# Patient Record
Sex: Male | Born: 1965 | Race: White | Hispanic: No | Smoking: Current every day smoker
Health system: Southern US, Community
[De-identification: ages and names within clinical notes are randomized; demographics above are authoritative.]

## PROBLEM LIST (undated history)

## (undated) DIAGNOSIS — K589 Irritable bowel syndrome without diarrhea: Secondary | ICD-10-CM

## (undated) HISTORY — PX: BACK SURGERY: SHX140

## (undated) HISTORY — PX: KNEE SURGERY: SHX244

## (undated) HISTORY — PX: CHOLECYSTECTOMY: SHX55

---

## 2006-06-05 ENCOUNTER — Emergency Department (HOSPITAL_COMMUNITY): Admission: EM | Admit: 2006-06-05 | Discharge: 2006-06-05 | Payer: Self-pay | Admitting: Emergency Medicine

## 2007-05-16 ENCOUNTER — Emergency Department (HOSPITAL_COMMUNITY): Admission: EM | Admit: 2007-05-16 | Discharge: 2007-05-16 | Payer: Self-pay | Admitting: Emergency Medicine

## 2011-10-08 ENCOUNTER — Emergency Department (HOSPITAL_BASED_OUTPATIENT_CLINIC_OR_DEPARTMENT_OTHER)
Admission: EM | Admit: 2011-10-08 | Discharge: 2011-10-08 | Disposition: A | Discharge status: Home / Self Care | Payer: Self-pay | Attending: Emergency Medicine | Admitting: Emergency Medicine

## 2011-10-08 ENCOUNTER — Encounter (HOSPITAL_BASED_OUTPATIENT_CLINIC_OR_DEPARTMENT_OTHER): Payer: Self-pay | Admitting: *Deleted

## 2011-10-08 DIAGNOSIS — K029 Dental caries, unspecified: Secondary | ICD-10-CM | POA: Insufficient documentation

## 2011-10-08 DIAGNOSIS — K1370 Unspecified lesions of oral mucosa: Secondary | ICD-10-CM | POA: Insufficient documentation

## 2011-10-08 DIAGNOSIS — F17200 Nicotine dependence, unspecified, uncomplicated: Secondary | ICD-10-CM | POA: Insufficient documentation

## 2011-10-08 MED ORDER — PENICILLIN V POTASSIUM 500 MG PO TABS: 500.0000 mg | ORAL_TABLET | Freq: Four times a day (QID) | ORAL | Status: AC

## 2011-10-08 MED ORDER — CLINDAMYCIN HCL 150 MG PO CAPS
300.0000 mg | ORAL_CAPSULE | Freq: Once | ORAL | Status: AC
  Administered 2011-10-08: 300 mg via ORAL
  Filled 2011-10-08: qty 2

## 2011-10-08 MED ORDER — HYDROCODONE-ACETAMINOPHEN 5-325 MG PO TABS: 1.0000 | ORAL_TABLET | Freq: Four times a day (QID) | ORAL | Status: AC | PRN

## 2011-10-08 MED ORDER — TRAMADOL HCL 50 MG PO TABS
50.0000 mg | ORAL_TABLET | Freq: Once | ORAL | Status: AC
  Administered 2011-10-08: 50 mg via ORAL
  Filled 2011-10-08: qty 1

## 2011-10-08 NOTE — ED Notes (Signed)
Dental pain    States has an abscess tooth causing pain and also headache

## 2011-10-08 NOTE — ED Provider Notes (Addendum)
History     CSN: [id number]  Arrival date & time 10/08/11  2218   First MD Initiated Contact with Patient 10/08/11 2258      Chief Complaint  Patient presents with  . Oral Swelling    (Consider location/radiation/quality/duration/timing/severity/associated sxs/prior treatment) Patient is a 46 y.o. male presenting with tooth pain. The history is provided by the patient. No language interpreter was used.  Dental PainThe primary symptoms include oral lesions. Primary symptoms do not include mouth pain, dental injury, oral bleeding, headaches, fever, shortness of breath, sore throat, angioedema or cough. The symptoms began 2 to 6 hours ago. The symptoms are worsening. The symptoms are new. The symptoms occur constantly.  The oral lesion began 3 - 5 days ago. The oral lesion is worsening. The oral lesion is new. Affected locations include: teeth.  Additional symptoms include: dental sensitivity to temperature and gum tenderness. Additional symptoms do not include: purulent gums, trismus, jaw pain, trouble swallowing, pain with swallowing, excessive salivation and swollen glands. Medical issues include: smoking.    History reviewed. No pertinent past medical history.  Past Surgical History  Procedure Date  . Back surgery   . Knee surgery     No family history on file.  History  Substance Use Topics  . Smoking status: Current Everyday Smoker -- 0.5 packs/day  . Smokeless tobacco: Not on file  . Alcohol Use: No      Review of Systems  Constitutional: Negative for fever.  HENT: Negative.  Negative for sore throat, trouble swallowing and neck pain.   Eyes: Negative.   Respiratory: Negative for cough and shortness of breath.   Cardiovascular: Negative.   Gastrointestinal: Negative.   Genitourinary: Negative.   Musculoskeletal: Negative.   Neurological: Negative for headaches.  Hematological: Negative.   Psychiatric/Behavioral: Negative.     Allergies  Review of patient's  allergies indicates no known allergies.  Home Medications   Current Outpatient Rx  Name Route Sig Dispense Refill  . ACETAMINOPHEN 500 MG PO TABS Oral Take 2,000 mg by mouth every 6 (six) hours as needed. For pain    . IBUPROFEN 200 MG PO TABS Oral Take 800 mg by mouth every 6 (six) hours as needed. For pain    . ADULT MULTIVITAMIN W/MINERALS CH Oral Take 1 tablet by mouth daily.      BP 150/80  Pulse 86  Temp(Src) 98.7 F (37.1 C) (Oral)  Resp 26  SpO2 99%  Physical Exam  Vitals reviewed. Constitutional: He is oriented to person, place, and time. He appears well-developed and well-nourished.  HENT:  Head: Normocephalic and atraumatic.  Mouth/Throat: Oropharynx is clear and moist and mucous membranes are normal. Abnormal dentition. Dental abscesses present. No oropharyngeal exudate or posterior oropharyngeal edema.         No swelling of the floor of the mouth, no swelling of the glands of the submandibular space.  No skin warmth no gum abscess No trismus No swelling of the lips tongue or uvula  Eyes: Conjunctivae are normal. Pupils are equal, round, and reactive to light.  Neck: No tracheal deviation present.  Cardiovascular: Normal rate and regular rhythm.   Pulmonary/Chest: Effort normal and breath sounds normal.  Abdominal: Soft. Bowel sounds are normal. There is no tenderness.  Musculoskeletal: Normal range of motion.  Lymphadenopathy:    He has no cervical adenopathy.  Neurological: He is alert and oriented to person, place, and time.  Skin: Skin is warm and dry.  Psychiatric: He has a normal  mood and affect.    ED Course  Procedures (including critical care time)  Labs Reviewed - No data to display No results found.   No diagnosis found.    MDM  Patient told to return for drainage, facial swelling inability to fully open mouth, shortness of breath difficulty swallowing or any concerns.  Take all antibiotics and see a dentist this week  Patient verbalizes  understanding and agrees to follow up       Eleri Ruben K Darrion Wyszynski-Rasch, MD 10/08/11 2333

## 2011-10-08 NOTE — ED Notes (Signed)
Swelling to the right side of his face for about 3 days. States he has an abscessed tooth.

## 2014-07-02 ENCOUNTER — Emergency Department (HOSPITAL_BASED_OUTPATIENT_CLINIC_OR_DEPARTMENT_OTHER)
Admission: EM | Admit: 2014-07-02 | Discharge: 2014-07-02 | Disposition: A | Discharge status: Home / Self Care | Payer: BC Managed Care – PPO | Attending: Emergency Medicine | Admitting: Emergency Medicine

## 2014-07-02 ENCOUNTER — Encounter (HOSPITAL_BASED_OUTPATIENT_CLINIC_OR_DEPARTMENT_OTHER): Payer: Self-pay | Admitting: Emergency Medicine

## 2014-07-02 ENCOUNTER — Emergency Department (HOSPITAL_BASED_OUTPATIENT_CLINIC_OR_DEPARTMENT_OTHER): Payer: BC Managed Care – PPO

## 2014-07-02 DIAGNOSIS — F419 Anxiety disorder, unspecified: Secondary | ICD-10-CM | POA: Insufficient documentation

## 2014-07-02 DIAGNOSIS — R202 Paresthesia of skin: Secondary | ICD-10-CM | POA: Insufficient documentation

## 2014-07-02 DIAGNOSIS — Z0130 Encounter for examination of blood pressure without abnormal findings: Secondary | ICD-10-CM

## 2014-07-02 DIAGNOSIS — Z0131 Encounter for examination of blood pressure with abnormal findings: Secondary | ICD-10-CM | POA: Diagnosis not present

## 2014-07-02 DIAGNOSIS — Z720 Tobacco use: Secondary | ICD-10-CM | POA: Diagnosis not present

## 2014-07-02 DIAGNOSIS — K589 Irritable bowel syndrome without diarrhea: Secondary | ICD-10-CM | POA: Diagnosis not present

## 2014-07-02 DIAGNOSIS — Z79899 Other long term (current) drug therapy: Secondary | ICD-10-CM | POA: Insufficient documentation

## 2014-07-02 DIAGNOSIS — R031 Nonspecific low blood-pressure reading: Secondary | ICD-10-CM | POA: Insufficient documentation

## 2014-07-02 HISTORY — DX: Irritable bowel syndrome, unspecified: K58.9

## 2014-07-02 LAB — BASIC METABOLIC PANEL WITH GFR
Anion gap: 14 (ref 5–15)
BUN: 17 mg/dL (ref 6–23)
CO2: 26 meq/L (ref 19–32)
Calcium: 9.1 mg/dL (ref 8.4–10.5)
Chloride: 101 meq/L (ref 96–112)
Creatinine, Ser: 1 mg/dL (ref 0.50–1.35)
GFR calc Af Amer: >90 mL/min
GFR calc non Af Amer: 87 mL/min — ABNORMAL LOW
Glucose, Bld: 103 mg/dL — ABNORMAL HIGH (ref 70–99)
Potassium: 4.3 meq/L (ref 3.7–5.3)
Sodium: 141 meq/L (ref 137–147)

## 2014-07-02 LAB — CBC
HCT: 40.8 % (ref 39.0–52.0)
Hemoglobin: 14.1 g/dL (ref 13.0–17.0)
MCH: 29.1 pg (ref 26.0–34.0)
MCHC: 34.6 g/dL (ref 30.0–36.0)
MCV: 84.1 fL (ref 78.0–100.0)
Platelets: 211 10*3/uL (ref 150–400)
RBC: 4.85 MIL/uL (ref 4.22–5.81)
RDW: 14.2 % (ref 11.5–15.5)
WBC: 6.4 10*3/uL (ref 4.0–10.5)

## 2014-07-02 LAB — TROPONIN I: Troponin I: <0.3 ng/mL (ref <0.30)

## 2014-07-02 NOTE — ED Notes (Signed)
PA at bedside.

## 2014-07-02 NOTE — ED Notes (Signed)
D/c home with family/friend- no new rx given - resource guide given

## 2014-07-02 NOTE — ED Notes (Addendum)
Walked in to the ED with a hand held BP monitor. States his monitor is telling him he is in cardiac arrest. He googled it. Denies pain. Tingling in his left hand and tongue for several days. He was recently started on medication for possible IBS.

## 2014-07-02 NOTE — ED Provider Notes (Signed)
CSN: [id number]     Arrival date & time 07/02/14  1348 History   First MD Initiated Contact with Patient 07/02/14 1429     Chief Complaint  Patient presents with  . Blood Pressure Check     (Consider location/radiation/quality/duration/timing/severity/associated sxs/prior Treatment) HPI Comments: This is a 48 year old male with a past medical history of IBS who presents to the emergency department with concerns of his heart after an abnormal reading on his handheld blood pressure monitor. Patient states 5 days ago he was started on until for his IBS, however since starting this medication he has been experiencing lip tingling and tingling into his fingers. He called the gastroenterologist who advised him to stop the medication. States he also told her GI specialist that he was getting a were did sound sensation when he was stepping as if he could "hear his steps water". She advised him to get a blood pressure monitor. When he used the monitor for the first time today, it read his blood pressure to be 105/56 with a heart rate of 145, called the GI specialist who advised him to go directly to the emergency department. Denies history of any cardiac problems. Denies chest pain, shortness of breath, nausea, vomiting or diaphoresis. He usually does not monitor his blood pressure at home. No history of hypertension. No family history of early heart disease before the age of 49, however his dad has a family history of triple bypass at an older age. He is a smoker.  The history is provided by the patient.    Past Medical History  Diagnosis Date  . IBS (irritable bowel syndrome)    Past Surgical History  Procedure Laterality Date  . Back surgery    . Knee surgery     No family history on file. History  Substance Use Topics  . Smoking status: Current Every Day Smoker -- 0.50 packs/day  . Smokeless tobacco: Not on file  . Alcohol Use: No    Review of Systems  Neurological:       +Tingling  sensation in hands and lips.  All other systems reviewed and are negative.     Allergies  Review of patient's allergies indicates no known allergies.  Home Medications   Prior to Admission medications   Medication Sig Start Date End Date Taking? Authorizing Provider  dicyclomine (BENTYL) 10 MG capsule Take 10 mg by mouth 4 (four) times daily -  before meals and at bedtime.   Yes Historical Provider, MD  acetaminophen (TYLENOL) 500 MG tablet Take 2,000 mg by mouth every 6 (six) hours as needed. For pain    Historical Provider, MD  ibuprofen (ADVIL,MOTRIN) 200 MG tablet Take 800 mg by mouth every 6 (six) hours as needed. For pain    Historical Provider, MD  Multiple Vitamin (MULITIVITAMIN WITH MINERALS) TABS Take 1 tablet by mouth daily.    Historical Provider, MD   BP 150/90  Pulse 86  Temp(Src) 98.2 F (36.8 C) (Oral)  Resp 20  Ht 6' (1.829 m)  Wt 242 lb (109.77 kg)  BMI 32.81 kg/m2  SpO2 95% Physical Exam  Nursing note and vitals reviewed. Constitutional: He is oriented to person, place, and time. He appears well-developed and well-nourished. No distress.  HENT:  Head: Normocephalic and atraumatic.  Mouth/Throat: Oropharynx is clear and moist.  Eyes: Conjunctivae and EOM are normal. Pupils are equal, round, and reactive to light.  Neck: Normal range of motion. Neck supple. No JVD present.  Cardiovascular: Normal rate,  regular rhythm, normal heart sounds and intact distal pulses.   No extremity edema.  Pulmonary/Chest: Effort normal and breath sounds normal. No respiratory distress.  Abdominal: Soft. Bowel sounds are normal. There is no tenderness.  Musculoskeletal: Normal range of motion. He exhibits no edema.  Neurological: He is alert and oriented to person, place, and time. He has normal strength. No sensory deficit.  Speech fluent, goal oriented. Moves limbs without ataxia. Equal grip strength bilateral.  Skin: Skin is warm and dry. He is not diaphoretic.   Psychiatric: His behavior is normal.  Anxious.    ED Course  Procedures (including critical care time) Labs Review Labs Reviewed  BASIC METABOLIC PANEL - Abnormal; Notable for the following:    Glucose, Bld 103 (*)    GFR calc non Af Amer 87 (*)    All other components within normal limits  CBC  TROPONIN I    Imaging Review Dg Chest 2 View  07/02/2014   CLINICAL DATA:  Left arm pain and tingling.  Hypertension.  EXAM: CHEST  2 VIEW  COMPARISON:  None.  FINDINGS: The heart size and mediastinal contours are within normal limits. Both lungs are clear. The visualized skeletal structures are unremarkable.  IMPRESSION: Normal chest.   Electronically Signed   By: Geanie CooleyJim  Maxwell M.D.   On: 07/02/2014 14:56     EKG Interpretation None      MDM   Final diagnoses:  Blood pressure check   Patient nontoxic-appearing, anxious but in no apparent distress. Afebrile, vital signs stable. Slight hypertension. Blood pressure cuff calibrated, it does not match our monitor. ED monitor: B/P: 141/92 HR: 92. At same time patient's wrist monitor read: B/P 81/56 HR:140. Advised patient to either get a new blood pressure cuff, or call the company to have this fixed. He is not having any chest pain or shortness of breath. Workup unremarkable. Doubt cardiac, HEART score 2. PERC negative. Reassurance given. Stable for discharge. Resources given for PCP establishment. Return precautions given. Patient states understanding of treatment care plan and is agreeable.   Kathrynn SpeedRobyn M Kyan Giannone, PA-C 07/02/14 2153

## 2014-07-02 NOTE — ED Notes (Signed)
Pt's b/p and HR taken on ED monitor: B/P: 141/92  HR: 92 At same time patient's wrist monitor read: B/P 81/56 HR:140

## 2014-07-02 NOTE — ED Notes (Addendum)
Pt reports he has been having lips and fingers tingling since starting bentyl 5 days ago- Spoke to his gastroenterologist who told him to stop taking the medicine- He used a home wrist b/p monitor which read his b/p as 105/56 and HR 145, he reported this to his GI doctor who then directed him to come to the ED for eval-

## 2014-07-02 NOTE — Discharge Instructions (Signed)
Followup with your gastroenterologist. Also followup with one of the resources below to establish care with a primary care physician. RESOURCE GUIDE  Chronic Pain Problems: Contact [facility]  [phone] Patients need to be referred by their primary care doctor.  Insufficient Money for Medicine: Contact [employer]:  call "211."   No Primary Care Doctor: - Call [facility]  [phone] - can help you locate a primary care doctor that  accepts your insurance, provides certain services, etc. - Physician Referral Service- [phone]  Agencies that provide inexpensive medical care: - [facility]  [phone] - [facility]  [phone] - [facility]  [phone] - [facility] Clinic  [phone] - [facility]  [phone] - [facility]  [phone]  Medicaid-accepting [facility] [place] Providers: - [facility]- [address], [address]  [phone], Mon-Fri 9am-7pm, Sat 9am-1pm - [facility]- [address], [address] [place]  [phone] - [facility]- [address], [address] [place]  [phone] [facility]- [facility]- [address] [place] [address]  [phone] - Renaye RakersVeita Bland- [address], [address], [phone]  Only accepts [place] patients after they have their name  applied to their card  Self Pay (no insurance) in [place]: - Sickle Cell Patients: Dr Willey BladeEric Dean, [facility]  [address], [phone] - [facility]- [address]  [phone]       [facility] G[facility]-     [facility]- [address], [address]       -     [facility]- see information above (Speak to [employer] H if you do not have insurance)       -  [facility] [place]- [address],  [phone]       -  [facility]- [address] [place] [address], [phone]       -  Dr Julio Sickssei-Bonsu-  [address], [address], [place],  [phone]       -  [facility] - [address], [phone]       -  [facility]- [address] [place] [address], [phone], also [address], [phone]       -    [facility]- [address], [phone], 1st & 3rd Saturday        every month, 10am-1pm  1) Find a Doctor and Pay Out of Pocket Although you won't have to find out who is covered by your insurance plan, it is a good idea to ask around and get recommendations. You will then need to call the office and see if the doctor you have chosen will accept you as a new patient and what types of options they offer for patients who are self-pay. Some doctors offer discounts or will set up payment plans for their patients who do not have insurance, but you will need to ask so you aren't surprised when you get to your appointment.  2) Contact Your Local Health Department Not all health departments have doctors that can see patients for sick visits, but many do, so it is worth a call to see if yours does. If you don't know where your local health department is, you can check in your phone book. The CDC also has a tool to help you locate your state's health department,  and many state websites also have listings of all of their local health departments.  3) Find a Walk-in Clinic If your illness is not likely to be very severe or complicated, you may want to try a walk in clinic. These are popping up all over the country in pharmacies, drugstores, and shopping centers. They're usually staffed by nurse practitioners or physician assistants that have been trained to treat common illnesses and complaints. They're usually fairly quick and inexpensive. However, if you have serious medical issues or chronic medical problems, these are probably not your best option How to Take Your Blood Pressure HOW DO I GET A BLOOD PRESSURE MACHINE?  You can buy an electronic home blood pressure machine at your local pharmacy.  Insurance will sometimes cover the cost if you have a prescription.  Ask your doctor what type of machine is best for you. There are different machines for your arm and your wrist.  If you decide to buy a machine to check your blood pressure on your arm, first check the size of your arm so you can buy the right size cuff. To check the size of your arm:   Use a measuring tape that shows both inches and centimeters.   Wrap the measuring tape around the upper-middle part of your arm. You may need someone to help you measure.   Write down your arm measurement in both inches and centimeters.   To measure your blood pressure correctly, it is important to have the right size cuff.   If your arm is up to 13 inches (up to 34 centimeters), get an adult cuff size.  If your arm is 13 to 17 inches (35 to 44 centimeters), get a large adult cuff size.    If your arm is 17 to 20 inches (45 to 52 centimeters), get an adult thigh cuff.  WHAT DO THE NUMBERS MEAN?   There are two numbers that make up your blood pressure. For example: 120/80.  The first number (120 in our example) is called the "systolic pressure." It is a measure of the pressure in your blood vessels when your heart is pumping blood.  The second number (80 in our example) is called the "diastolic pressure." It is a measure of the pressure in your blood vessels when your heart is resting between beats.  Your doctor will tell you what your blood pressure should be. WHAT SHOULD I DO BEFORE I CHECK MY BLOOD PRESSURE?   Try to rest or relax for at least 30 minutes before you check your blood pressure.  Do not smoke.  Do not have any drinks with caffeine, such as:  Soda.  Coffee.  Tea.  Check your blood pressure in a quiet room.  Sit down and stretch out your arm on a table. Keep your arm at about the level of your heart. Let your arm relax.  Make sure that your legs are not crossed. HOW DO I CHECK MY BLOOD  PRESSURE?  Follow the directions that came with your machine.  Make sure you remove any tight-fighting clothing from your arm or wrist. Wrap the cuff around your upper arm or wrist. You should be able to fit a finger between the cuff and your arm. If you cannot fit a finger between the cuff and your arm, it is too tight and should be removed and rewrapped.  Some units require you to manually pump up the arm cuff.  Automatic units inflate the cuff when you press a  button.  Cuff deflation is automatic in both models.  After the cuff is inflated, the unit measures your blood pressure and pulse. The readings are shown on a monitor. Hold still and breathe normally while the cuff is inflated.  Getting a reading takes less than a minute.  Some models store readings in a memory. Some provide a printout of readings. If your machine does not store your readings, keep a written record.  Take readings with you to your next visit with your doctor. Document Released: 08/19/2008 Document Revised: 01/21/2014 Document Reviewed: 11/01/2013 [facility] Patient Information 2015 [place], [place]. This information is not intended to replace advice given to you by your health care provider. Make sure you discuss any questions you have with your health care provider.

## 2014-07-02 NOTE — ED Notes (Signed)
Patient transported to X-ray 

## 2014-07-06 NOTE — ED Provider Notes (Signed)
Medical screening examination/treatment/procedure(s) were performed by non-physician practitioner and as supervising physician I was immediately available for consultation/collaboration.   EKG Interpretation   Date/Time:  Tuesday July 02 2014 13:57:58 EDT Ventricular Rate:  108 PR Interval:  144 QRS Duration: 108 QT Interval:  330 QTC Calculation: 442 R Axis:   94 Text Interpretation:  Sinus tachycardia Rightward axis Borderline ECG ED  PHYSICIAN INTERPRETATION AVAILABLE IN [facility] Confirmed by TEST,  Record ([id number]) on 07/04/2014 7:06:07 AM        Toy CookeyMegan Docherty, MD 07/06/14 [id number]

## 2021-10-08 ENCOUNTER — Encounter (HOSPITAL_COMMUNITY): Payer: Self-pay | Admitting: Emergency Medicine

## 2021-10-08 ENCOUNTER — Emergency Department (HOSPITAL_COMMUNITY): Payer: Commercial Managed Care - HMO

## 2021-10-08 ENCOUNTER — Emergency Department (HOSPITAL_COMMUNITY)
Admission: EM | Admit: 2021-10-08 | Discharge: 2021-10-09 | Disposition: A | Discharge status: Home / Self Care | Payer: Commercial Managed Care - HMO | Attending: Emergency Medicine | Admitting: Emergency Medicine

## 2021-10-08 DIAGNOSIS — K625 Hemorrhage of anus and rectum: Secondary | ICD-10-CM | POA: Diagnosis not present

## 2021-10-08 DIAGNOSIS — R1013 Epigastric pain: Secondary | ICD-10-CM | POA: Insufficient documentation

## 2021-10-08 LAB — URINALYSIS, ROUTINE W REFLEX MICROSCOPIC
Bacteria, UA: NONE SEEN
Bilirubin Urine: NEGATIVE
Glucose, UA: NEGATIVE mg/dL
Ketones, ur: NEGATIVE mg/dL
Leukocytes,Ua: NEGATIVE
Nitrite: NEGATIVE
Protein, ur: NEGATIVE mg/dL
Specific Gravity, Urine: 1.019 (ref 1.005–1.030)
pH: 5 (ref 5.0–8.0)

## 2021-10-08 LAB — COMPREHENSIVE METABOLIC PANEL
ALT: 29 U/L (ref 0–44)
AST: 22 U/L (ref 15–41)
Albumin: 4.5 g/dL (ref 3.5–5.0)
Alkaline Phosphatase: 104 U/L (ref 38–126)
Anion gap: 10 (ref 5–15)
BUN: 15 mg/dL (ref 6–20)
CO2: 29 mmol/L (ref 22–32)
Calcium: 9.1 mg/dL (ref 8.9–10.3)
Chloride: 101 mmol/L (ref 98–111)
Creatinine, Ser: 0.92 mg/dL (ref 0.61–1.24)
GFR, Estimated: >60 mL/min (ref >60)
Glucose, Bld: 132 mg/dL — ABNORMAL HIGH (ref 70–99)
Potassium: 4.1 mmol/L (ref 3.5–5.1)
Sodium: 140 mmol/L (ref 135–145)
Total Bilirubin: 0.3 mg/dL (ref 0.3–1.2)
Total Protein: 8.7 g/dL — ABNORMAL HIGH (ref 6.5–8.1)

## 2021-10-08 LAB — CBC WITH DIFFERENTIAL/PLATELET
Abs Immature Granulocytes: 0.02 10*3/uL (ref 0.00–0.07)
Basophils Absolute: 0.1 10*3/uL (ref 0.0–0.1)
Basophils Relative: 1 %
Eosinophils Absolute: 0.1 10*3/uL (ref 0.0–0.5)
Eosinophils Relative: 2 %
HCT: 53.5 % — ABNORMAL HIGH (ref 39.0–52.0)
Hemoglobin: 17.4 g/dL — ABNORMAL HIGH (ref 13.0–17.0)
Immature Granulocytes: 0 %
Lymphocytes Relative: 33 %
Lymphs Abs: 2.8 10*3/uL (ref 0.7–4.0)
MCH: 29.6 pg (ref 26.0–34.0)
MCHC: 32.5 g/dL (ref 30.0–36.0)
MCV: 91 fL (ref 80.0–100.0)
Monocytes Absolute: 0.6 10*3/uL (ref 0.1–1.0)
Monocytes Relative: 7 %
Neutro Abs: 4.9 10*3/uL (ref 1.7–7.7)
Neutrophils Relative %: 57 %
Platelets: 268 10*3/uL (ref 150–400)
RBC: 5.88 MIL/uL — ABNORMAL HIGH (ref 4.22–5.81)
RDW: 13.4 % (ref 11.5–15.5)
WBC: 8.5 10*3/uL (ref 4.0–10.5)
nRBC: 0 % (ref 0.0–0.2)

## 2021-10-08 LAB — LIPASE, BLOOD: Lipase: 27 U/L (ref 11–51)

## 2021-10-08 MED ORDER — IOHEXOL 350 MG/ML SOLN
100.0000 mL | Freq: Once | INTRAVENOUS | Status: AC | PRN
  Administered 2021-10-08: 100 mL via INTRAVENOUS

## 2021-10-08 NOTE — ED Triage Notes (Signed)
Pt here from home with c/o dark stools off and on for 3 weeks , , c/o upper abd pain

## 2021-10-08 NOTE — ED Provider Notes (Signed)
[facility]-EMERGENCY DEPT Provider Note   CSN: [id number] Arrival date & time: 10/08/21  1841     History  No chief complaint on file.   Nathaniel Stephens is a 56 y.o. male.  HPI Patient is a 56 year old male with a history of IBS who presents to the emergency department due to abdominal pain.  Patient states that he had COVID-19 in July of last year.  Since then he has been experiencing waxing and waning constipation and typically has 1-2 bowel movements per week.  He states that about 3 weeks ago began experiencing upper abdominal pain.  He then began experiencing a small amount of bright red blood with bowel movements for the past 2 weeks.  He states last night he passed gas and a large amount of bright red blood in the toilet.  He states that he was evaluated by his PCP earlier today who noted hematochezia on his rectal exam and sent him to the emergency department for further evaluation.  Denies any regular alcohol use.  He states he takes ibuprofen about twice per week but otherwise denies any regular NSAID use.  States that he had a colonoscopy about 2 years ago which showed polyps.  Denies any other complaints.    Home Medications Prior to Admission medications   Medication Sig Start Date End Date Taking? Authorizing Provider  omeprazole (PRILOSEC) 20 MG capsule Take 1 capsule (20 mg total) by mouth daily. 10/09/21  Yes Placido Sou, PA-C  acetaminophen (TYLENOL) 500 MG tablet Take 2,000 mg by mouth every 6 (six) hours as needed. For pain    [provider]  dicyclomine (BENTYL) 10 MG capsule Take 10 mg by mouth 4 (four) times daily -  before meals and at bedtime.    [provider]  ibuprofen (ADVIL,MOTRIN) 200 MG tablet Take 800 mg by mouth every 6 (six) hours as needed. For pain    [provider]  Multiple Vitamin (MULITIVITAMIN WITH MINERALS) TABS Take 1 tablet by mouth daily.    [provider]      Allergies     Patient has no known allergies.    Review of Systems   Review of Systems  All other systems reviewed and are negative. Ten systems reviewed and are negative for acute change, except as noted in the HPI.   Physical Exam Updated Vital Signs BP (!) 134/100    Pulse 85    Temp 98.2 F (36.8 C) (Oral)    Resp 16    SpO2 97%  Physical Exam Vitals and nursing note reviewed.  Constitutional:      General: He is not in acute distress.    Appearance: Normal appearance. He is not ill-appearing, toxic-appearing or diaphoretic.  HENT:     Head: Normocephalic and atraumatic.     Right Ear: External ear normal.     Left Ear: External ear normal.     Nose: Nose normal.     Mouth/Throat:     Mouth: Mucous membranes are moist.     Pharynx: Oropharynx is clear. No oropharyngeal exudate or posterior oropharyngeal erythema.  Eyes:     General: No scleral icterus.       Right eye: No discharge.        Left eye: No discharge.     Extraocular Movements: Extraocular movements intact.     Conjunctiva/sclera: Conjunctivae normal.  Cardiovascular:     Rate and Rhythm: Normal rate and regular rhythm.  Pulses: Normal pulses.     Heart sounds: Normal heart sounds. No murmur heard.   No friction rub. No gallop.  Pulmonary:     Effort: Pulmonary effort is normal. No respiratory distress.     Breath sounds: Normal breath sounds. No stridor. No wheezing, rhonchi or rales.  Abdominal:     General: Abdomen is flat.     Palpations: Abdomen is soft.     Tenderness: There is abdominal tenderness.     Comments: Protuberant abdomen that is soft.  Moderate tenderness noted overlying the epigastrium.  Genitourinary:    Comments: Nursing chaperone present.  Normal-appearing anal region.  No visible or palpable hemorrhoids noted.  Small amount of brown stool noted in the rectal vault.  No hematochezia or melena.  No tenderness appreciated throughout the exam. Musculoskeletal:        General: Normal range of  motion.     Cervical back: Normal range of motion and neck supple. No tenderness.  Skin:    General: Skin is warm and dry.  Neurological:     General: No focal deficit present.     Mental Status: He is alert and oriented to person, place, and time.  Psychiatric:        Mood and Affect: Mood normal.        Behavior: Behavior normal.   ED Results / Procedures / Treatments   Labs (all labs ordered are listed, but only abnormal results are displayed) Labs Reviewed  COMPREHENSIVE METABOLIC PANEL - Abnormal; Notable for the following components:      Result Value   Glucose, Bld 132 (*)    Total Protein 8.7 (*)    All other components within normal limits  CBC WITH DIFFERENTIAL/PLATELET - Abnormal; Notable for the following components:   RBC 5.88 (*)    Hemoglobin 17.4 (*)    HCT 53.5 (*)    All other components within normal limits  URINALYSIS, ROUTINE W REFLEX MICROSCOPIC - Abnormal; Notable for the following components:   Hgb urine dipstick MODERATE (*)    All other components within normal limits  LIPASE, BLOOD  POC OCCULT BLOOD, ED   EKG None  Radiology CT Angio Abd/Pel W and/or Wo Contrast  Result Date: 10/08/2021 CLINICAL DATA:  Rectal bleeding.  Upper abdominal pain. EXAM: CTA ABDOMEN AND PELVIS WITHOUT AND WITH CONTRAST TECHNIQUE: Multidetector CT imaging of the abdomen and pelvis was performed using the standard protocol during bolus administration of intravenous contrast. Multiplanar reconstructed images and MIPs were obtained and reviewed to evaluate the vascular anatomy. RADIATION DOSE REDUCTION: This exam was performed according to the departmental dose-optimization program which includes automated exposure control, adjustment of the mA and/or kV according to patient size and/or use of iterative reconstruction technique. CONTRAST:  153mL OMNIPAQUE IOHEXOL 350 MG/ML SOLN COMPARISON:  CT abdomen and pelvis 08/30/2014. FINDINGS: VASCULAR Aorta: Normal caliber aorta without  aneurysm, dissection, vasculitis or significant stenosis. There are atherosclerotic calcifications in the aorta. Celiac: Patent without evidence of aneurysm, dissection, vasculitis or significant stenosis. SMA: Patent without evidence of aneurysm, dissection, vasculitis or significant stenosis. Renals: Both renal arteries are patent without evidence of aneurysm, dissection, vasculitis, fibromuscular dysplasia or significant stenosis. There is an accessory left renal artery. IMA: Patent without evidence of aneurysm, dissection, vasculitis or significant stenosis. Inflow: Patent without evidence of aneurysm, dissection, vasculitis or significant stenosis. Atherosclerotic calcifications are present. Proximal Outflow: Bilateral common femoral and visualized portions of the superficial and profunda femoral arteries are patent without evidence of  aneurysm, dissection, vasculitis or significant stenosis. Veins: No obvious venous abnormality within the limitations of this arterial phase study. Review of the MIP images confirms the above findings. NON-VASCULAR Lower chest: No acute abnormality. Hepatobiliary: No focal liver abnormality is seen. Status post cholecystectomy. No biliary dilatation. Pancreas: Unremarkable. No pancreatic ductal dilatation or surrounding inflammatory changes. Spleen: Normal in size without focal abnormality. Adrenals/Urinary Tract: There is a 4.2 cm left renal cyst. Otherwise, the adrenal glands, kidneys, and bladder are within normal limits. Stomach/Bowel: Stomach is within normal limits. Appendix appears normal. No evidence of bowel wall thickening, distention, or inflammatory changes. There is no evidence for active gastrointestinal bleeding. There is colonic diverticulosis without evidence for acute diverticulitis. Lymphatic: No enlarged lymph nodes are identified. Reproductive: Prostate is unremarkable. Other: There are small fat containing inguinal hernias. There is no ascites or free air.  Musculoskeletal: Degenerative changes affect the spine and hips. IMPRESSION: VASCULAR 1. No active gastrointestinal bleeding identified. No acute vascular pathology identified. 2.  Aortic Atherosclerosis (ICD10-I70.0). NON-VASCULAR 1. No acute process in the abdomen or pelvis. 2. Colonic diverticulosis. 3. 4.2 cm left renal cyst. Electronically Signed   By: Ronney Asters M.D.   On: 10/08/2021 23:35    Procedures Procedures   Medications Ordered in ED Medications  iohexol (OMNIPAQUE) 350 MG/ML injection 100 mL (100 mLs Intravenous Contrast Given 10/08/21 2308)   ED Course/ Medical Decision Making/ A&P                           Medical Decision Making Amount and/or Complexity of Data Reviewed Labs: ordered. Radiology: ordered.  Risk Prescription drug management.  Pt is a 56 y.o. male who presents to the emergency department due to epigastric pain as well as rectal bleeding.  Labs: CBC with RBCs of 5.88, hemoglobin of 17.4, hematocrit of 53.5. CMP with a glucose of 132 and a total protein of 8.7. Lipase of 27. Hemoccult is negative.  Imaging: CTA of the abdomen/pelvis shows  IMPRESSION: VASCULAR 1. No active gastrointestinal bleeding identified. No acute vascular pathology identified. 2.  Aortic Atherosclerosis (ICD10-I70.0). NON-VASCULAR 1. No acute process in the abdomen or pelvis. 2. Colonic diverticulosis. 3. 4.2 cm left renal cyst. Electronically Signed   By: Ronney Asters M.D.   On: 10/08/2021 23:35    I, Rayna Sexton, PA-C, personally reviewed and evaluated these images and lab results as part of my medical decision-making.  On my exam patient has a protuberant abdomen that is soft.  Moderate tenderness noted overlying the epigastrium.  Rectal exam is reassuring.  No visible or palpable hemorrhoids.  Small amount of brown stool but no hematochezia or melena.  Hemoccult is negative.  Patient noted a large amount of rectal bleeding last night when having a bowel movement so I  obtained a CTA of the abdomen/pelvis which appears reassuring.  No acute process in the abdomen or pelvis.  No active GI bleeding identified.  No acute vascular pathology identified.  They do note colonic diverticulosis.  Possibly the source of his symptoms last night, though difficult to tell given he is currently having no bleeding or LLQ tenderness.  Patient afebrile and not tachycardic.  No leukocytosis on CBC.  Doubt infectious etiology.  Feel the patient is stable for discharge at this time and he is agreeable.  Will discharge on a course of omeprazole.  We will give patient a referral to gastroenterology.  We discussed return precautions.  His questions were answered  and he was amicable at the time of discharge.  Note: Portions of this report may have been transcribed using voice recognition software. Every effort was made to ensure accuracy; however, inadvertent computerized transcription errors may be present.   Final Clinical Impression(s) / ED Diagnoses Final diagnoses:  Epigastric pain  Rectal bleeding   Rx / DC Orders ED Discharge Orders          Ordered    omeprazole (PRILOSEC) 20 MG capsule  Daily        10/09/21 0020              Rayna Sexton, PA-C 10/09/21 2335    Lacretia Leigh, MD 10/18/21 1605

## 2021-10-09 LAB — POC OCCULT BLOOD, ED: Fecal Occult Bld: NEGATIVE

## 2021-10-09 MED ORDER — OMEPRAZOLE 20 MG PO CPDR: 20.0000 mg | DELAYED_RELEASE_CAPSULE | Freq: Every day | ORAL | 0 refills | Status: DC

## 2021-10-09 NOTE — Discharge Instructions (Signed)
I am prescribing a medication called omeprazole.  This reduces stomach acid production and will hopefully help with your upper abdominal pain.  Please take this once per day for the next 30 days.  Below is the contact information for [facility] gastroenterology.  Please give them a call as soon as possible and schedule an appointment for reevaluation.  If you develop any new or worsening symptoms please come back to the emergency department immediately.  It was a pleasure to meet you.

## 2021-11-27 ENCOUNTER — Encounter: Payer: Self-pay | Admitting: Pulmonary Disease

## 2021-11-27 ENCOUNTER — Ambulatory Visit: Payer: Managed Care, Other (non HMO) | Admitting: Pulmonary Disease

## 2021-11-27 ENCOUNTER — Other Ambulatory Visit: Payer: Self-pay

## 2021-11-27 VITALS — BP 136/80 | HR 96 | Ht 71.0 in | Wt 332.4 lb

## 2021-11-27 DIAGNOSIS — K921 Melena: Secondary | ICD-10-CM

## 2021-11-27 DIAGNOSIS — G4719 Other hypersomnia: Secondary | ICD-10-CM

## 2021-11-27 DIAGNOSIS — U099 Post covid-19 condition, unspecified: Secondary | ICD-10-CM

## 2021-11-27 DIAGNOSIS — R0609 Other forms of dyspnea: Secondary | ICD-10-CM

## 2021-11-27 MED ORDER — FLUTICASONE-SALMETEROL 250-50 MCG/ACT IN AEPB: 1.0000 | INHALATION_SPRAY | Freq: Two times a day (BID) | RESPIRATORY_TRACT | 5 refills | Status: AC

## 2021-11-27 MED ORDER — FLUTICASONE-UMECLIDIN-VILANT 200-62.5-25 MCG/ACT IN AEPB: 1.0000 | INHALATION_SPRAY | Freq: Every day | RESPIRATORY_TRACT | 0 refills | Status: AC

## 2021-11-27 NOTE — Progress Notes (Signed)
Patient seen in the office today and instructed on use of Trelegy 200.  Patient expressed understanding and demonstrated technique.  

## 2021-11-27 NOTE — Patient Instructions (Addendum)
Peri-operative Assessment of Pulmonary Risk for Non-Thoracic Surgery: ? ?For Nathaniel Stephens, risk of perioperative pulmonary complications is increased by: Presumed COPD, Smoking ? ?ARISCAT score ?1.6% risk of in-hospital post-op pulmonary complications (composite including respiratory failure, respiratory infection, pleural effusion, atelectasis, pneumothorax, bronchospasm, aspiration pneumonitis) ? ?Respiratory complications generally occur in 1% of ASA Class I patients, 5% of ASA Class II and 10% of ASA Class III-IV patients These complications rarely result in mortality and include postoperative pneumonia, atelectasis, pulmonary embolism, ARDS and increased time requiring postoperative mechanical ventilation. ? ?Overall, I recommend proceeding with the surgery if the risk for respiratory complications are outweighed by the potential benefits. This will need to be discussed between the patient and surgeon. ? ?To reduce risks of respiratory complications, I recommend: ?--Smoking cessation 6-8 weeks if possible ?--Pre- and post-operative incentive spirometry performed frequently while awake ?--Avoiding use of pancuronium during anesthesia. ? ?I have discussed the risk factors and recommendations above with the patient. ? ?_______________________________________________________ ?Hematochezia ?--From pulmonary standpoint, would not delay testing while awaiting full pulmonary evaluation ?--Patient to start bronchodilators as noted below ? 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 Patient does not need to wait to have this completed before his colonoscopy ?--START Advair ONE puff TWICE a day. Provide Trelegy (ONCE a day) samples  ?--START Albuterol AS NEEDED for shortness of breath or wheezing ? ?Excessive daytime sleepiness ?--ORDER home sleep study ? ?Follow-up with me in March or April ? ? ?

## 2021-11-27 NOTE — Progress Notes (Signed)
Subjective:   PATIENT ID: Nathaniel Stephens GENDER: male DOB: July 27, 1966, MRN: [id number]   HPI  Chief Complaint  Patient presents with   Consult    Wheezing doe    Reason for Visit: New consult for pulmonary clearance for colonoscopy  Nathaniel Stephens is a 56 year old male active smoker with IBS, constipation who presents for pre-op evaluation  [facility] on 10/08/2021 for hematochezia epigastric pain and constipation.  His last colon[facility] was 2 years ago [facility] which showed polyps.  He had a CT abdomen pelvis with and without contrast that did not show any acute abnormalities.  Had diverticulosis but no diverticulitis.  [facility] by PA Celso Amyina Garrett on 11/03/2021.  Hemoglobin 15.5 on that visit.  Absolute eosinophils 100.  Due to his smoking, shortness of breath and obesity he was deemed to be high risk for colonoscopy.  He was referred to pulmonary to evaluate prior to procedure.  Patient does not have PCP.  He has never been formally diagnosed with COPD/asthma. He had COVID in 03/2020 and reports worsening shortness of breath and wheezing. Occurs with exertion. Denies chronic cough. Denies nocturnal symptoms. He is short of breath with short distances including walking to the bathroom. Reports 50 lb weight gain in the last two years.  He denies snoring however reports wakes up 2-3 times a night. Has nocturia. Sleeps five hours nightly. Denies fall asleep while talking. Reports excessive daytime sleepiness.  Social History: Active smoker. Started at 25 years ago. 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  Past Medical History:  Diagnosis Date   IBS (irritable bowel syndrome)      Family History  Problem Relation Age of Onset   High blood pressure Mother    Diabetes Mother     Irritable bowel syndrome Mother    Valvular heart disease Father    Heart failure Sister    Diabetes Sister    Diabetes Maternal Aunt    Sleep apnea Maternal Aunt    Heart failure Paternal Aunt    Heart Problems Paternal Uncle      Social History   Occupational History   Not on file  Tobacco Use   Smoking status: Every Day    Packs/day: 1.00    Years: 25.00    Pack years: 25.00    Types: Cigarettes   Smokeless tobacco: Not on file   Tobacco comments:    Pt states he's down to 5 cigarettes a day  Substance and Sexual Activity   Alcohol use: No   Drug use: No   Sexual activity: Not on file    No Known Allergies   Outpatient Medications Prior to Visit  Medication Sig Dispense Refill   acetaminophen (TYLENOL) 500 MG tablet Take 2,000 mg by mouth every 6 (six) hours as needed. For pain     dicyclomine (BENTYL) 10 MG capsule Take 10 mg by mouth 4 (four) times daily -  before meals and at bedtime.     ibuprofen (ADVIL,MOTRIN) 200 MG tablet Take 800 mg by mouth every 6 (six) hours as needed. For pain     Multiple Vitamin (MULITIVITAMIN WITH MINERALS) TABS Take 1 tablet by mouth daily.     omeprazole (PRILOSEC) 20 MG capsule Take 1 capsule (20 mg total) by mouth daily. (Patient not taking: Reported on 11/27/2021) 30  capsule 0   No facility-administered medications prior to visit.    Review of Systems  Constitutional:  Negative for chills, diaphoresis, fever, malaise/fatigue and weight loss.  HENT:  Negative for congestion.   Respiratory:  Positive for shortness of breath and wheezing. Negative for cough, hemoptysis and sputum production.   Cardiovascular:  Negative for chest pain, palpitations and leg swelling.    Objective:   Vitals:   11/27/21 1402  BP: 136/80  Pulse: 96  SpO2: 93%  Weight: (!) 332 lb 6.4 oz (150.8 kg)  Height: 5\' 11"  (1.803 m)   SpO2: 93 % O2 Device: None (Room air)  Physical Exam: General: Well-appearing, no acute distress HENT: [place], AT Eyes:  EOMI, no scleral icterus Respiratory: Diminished breath sounds to auscultation bilaterally.  No crackles, wheezing or rales Cardiovascular: RRR, -M/R/G, no JVD Extremities:-Edema,-tenderness Neuro: AAO x4, CNII-XII grossly intact Psych: Normal mood, normal affect  Data Reviewed:  Imaging: CXR 07/02/2014-normal chest x-ray.  No infiltrate effusion or edema CTA abdomen pelvis 10/08/2021-no active GI bleed.  Lower lobe lungs with no parenchymal abnormalities.  PFT: None on file  Labs: CBC    Component Value Date/Time   WBC 8.5 10/08/2021 1955   RBC 5.88 (H) 10/08/2021 1955   HGB 17.4 (H) 10/08/2021 1955   HCT 53.5 (H) 10/08/2021 1955   PLT 268 10/08/2021 1955   MCV 91.0 10/08/2021 1955   MCH 29.6 10/08/2021 1955   MCHC 32.5 10/08/2021 1955   RDW 13.4 10/08/2021 1955   LYMPHSABS 2.8 10/08/2021 1955   MONOABS 0.6 10/08/2021 1955   EOSABS 0.1 10/08/2021 1955   BASOSABS 0.1 10/08/2021 1955   Absolute eos 10/08/21 - 100  BMET    Component Value Date/Time   NA 140 10/08/2021 1955   K 4.1 10/08/2021 1955   CL 101 10/08/2021 1955   CO2 29 10/08/2021 1955   GLUCOSE 132 (H) 10/08/2021 1955   BUN 15 10/08/2021 1955   CREATININE 0.92 10/08/2021 1955   CALCIUM 9.1 10/08/2021 1955   GFRNONAA >60 10/08/2021 1955      Assessment & Plan:   Discussion: 56 year old male active smoker with IBS, constipation who presents for pre-op evaluation. Suspect he has COPD +/- COVID-19 long hauler. Will manage with ICS/LABA to optimize his respiratory symptoms and advised smoking cessation prior to procedure. He will also need Pulmonary function tests for further evaluation however he does not need to wait for these to be completed before his colonoscopy.  Hematochezia --From pulmonary standpoint, would not delay testing while awaiting full pulmonary evaluation --Patient to start bronchodilators as noted below  Chronic bronchitis COVID-19 long hauler with chronic dyspnea --ARRANGE pulmonary  function tests. Patient does not need to wait to have this completed before his colonoscopy --START Advair ONE puff TWICE a day. Provide Trelegy (ONCE a day) samples  --START Albuterol AS NEEDED for shortness of breath or wheezing  Excessive daytime sleepiness --ORDER home sleep study ____________________________________________________________________________  Peri-operative Assessment of Pulmonary Risk for Non-Thoracic Surgery:  For Nathaniel Stephens, risk of perioperative pulmonary complications is increased by: Presumed COPD, Smoking  Respiratory complications generally occur in 1% of ASA Class I patients, 5% of ASA Class II and 10% of ASA Class III-IV patients These complications rarely result in mortality and include postoperative pneumonia, atelectasis, pulmonary embolism, ARDS and increased time requiring postoperative mechanical ventilation.  Overall, I recommend proceeding with the surgery if the risk for respiratory complications are outweighed by the potential benefits. This will need to be discussed between  the patient and surgeon.  To reduce risks of respiratory complications, I recommend: --Smoking cessation 6-8 weeks if possible --Pre- and post-operative incentive spirometry performed frequently while awake --Avoiding use of pancuronium during anesthesia.  I have discussed the risk factors and recommendations above with the patient.  ARISCAT score 1.6% risk of in-hospital post-op pulmonary complications (composite including respiratory failure, respiratory infection, pleural effusion, atelectasis, pneumothorax, bronchospasm, aspiration pneumonitis) _________________________________________________________________________________ Health Maintenance  There is no immunization history on file for this patient. CT Lung Screen - Discuss at next visit  Orders Placed This Encounter  Procedures   Pulmonary function test    Standing Status:   Future    Standing Expiration Date:    11/28/2022    Order Specific Question:   Where should this test be performed?    Answer:   [facility]    Order Specific Question:   Full PFT: includes the following: basic spirometry, spirometry pre & post bronchodilator, diffusion capacity (DLCO), lung volumes    Answer:   Full PFT   Home sleep test    Standing Status:   Future    Standing Expiration Date:   11/28/2022    Order Specific Question:   Where should this test be performed:    Answer:   [facility]   Meds ordered this encounter  Medications   fluticasone-salmeterol (ADVAIR) 250-50 MCG/ACT AEPB    Sig: Inhale 1 puff into the lungs every 12 (twelve) hours.    Dispense:  60 each    Refill:  5   Fluticasone-Umeclidin-Vilant 200-62.5-25 MCG/ACT AEPB    Sig: Inhale 1 puff into the lungs daily.    Dispense:  28 each    Refill:  0    No follow-ups on file. After PFTs  I have spent a total time of 45-minutes on the day of the appointment reviewing prior documentation, coordinating care and discussing medical diagnosis and plan with the patient/family. Imaging, labs and tests included in this note have been reviewed and interpreted independently by me.  Caera Enwright Mechele Collin, MD [facility] 11/27/2021 3:45 PM  Office Number [phone]

## 2022-01-06 ENCOUNTER — Ambulatory Visit: Payer: Managed Care, Other (non HMO) | Admitting: Pulmonary Disease

## 2022-01-06 NOTE — Progress Notes (Deleted)
Subjective:   PATIENT ID: [name] GENDER: male DOB: [birth year], MRN: [id number]   HPI  No chief complaint on file.   Reason for Visit: New consult for pulmonary clearance for colonoscopy  Mr. Nathaniel Stephens is a 56 year old male active smoker with IBS, constipation who presents for pre-op evaluation  He was recently seen at [facility] long ED on 10/08/2021 for hematochezia epigastric pain and constipation.  His last colonoscopy was 2 years ago at [facility] which showed polyps.  He had a CT abdomen pelvis with and without contrast that did not show any acute abnormalities.  Had diverticulosis but no diverticulitis.  He was seen by [facility] gastroenterology by PA Celso Amy on 11/03/2021.  Hemoglobin 15.5 on that visit.  Absolute eosinophils 100.  Due to his smoking, shortness of breath and obesity he was deemed to be high risk for colonoscopy.  He was referred to pulmonary to evaluate prior to procedure.  Patient does not have PCP.  He has never been formally diagnosed with COPD/asthma. He had COVID in 03/2020 and reports worsening shortness of breath and wheezing. Occurs with exertion. Denies chronic cough. Denies nocturnal symptoms. He is short of breath with short distances including walking to the bathroom. Reports 50 lb weight gain in the last two years.  He denies snoring however reports wakes up 2-3 times a night. Has nocturia. Sleeps five hours nightly. Denies fall asleep while talking. Reports excessive daytime sleepiness.  01/06/22 Since his last visit he was started on Advair.  PFTs were scheduled however not completed.  Shortness of breath and wheezing***  Social History: Active smoker. Started at 25 years ago. 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   Past Medical History:  Diagnosis Date   IBS (irritable bowel syndrome)      Family History  Problem Relation Age of Onset   High blood pressure Mother    Diabetes Mother    Irritable  bowel syndrome Mother    Valvular heart disease Father    Heart failure Sister    Diabetes Sister    Diabetes Maternal Aunt    Sleep apnea Maternal Aunt    Heart failure Paternal Aunt    Heart Problems Paternal Uncle      Social History   Occupational History   Not on file  Tobacco Use   Smoking status: Every Day    Packs/day: 1.00    Years: 25.00    Pack years: 25.00    Types: Cigarettes   Smokeless tobacco: Not on file   Tobacco comments:    Pt states he's down to 5 cigarettes a day  Substance and Sexual Activity   Alcohol use: No   Drug use: No   Sexual activity: Not on file    No Known Allergies   Outpatient Medications Prior to Visit  Medication Sig Dispense Refill   acetaminophen (TYLENOL) 500 MG tablet Take 2,000 mg by mouth every 6 (six) hours as needed. For pain     dicyclomine (BENTYL) 10 MG capsule Take 10 mg by mouth 4 (four) times daily -  before meals and at bedtime.     fluticasone-salmeterol (ADVAIR) 250-50 MCG/ACT AEPB Inhale 1 puff into the lungs every 12 (twelve) hours. 60 each 5   Fluticasone-Umeclidin-Vilant 200-62.5-25 MCG/ACT AEPB Inhale 1 puff into the lungs daily. 28 each 0   ibuprofen (ADVIL,MOTRIN) 200 MG tablet Take 800 mg by mouth every 6 (six) hours as needed. For pain  Multiple Vitamin (MULITIVITAMIN WITH MINERALS) TABS Take 1 tablet by mouth daily.     No facility-administered medications prior to visit.    ROS   Objective:   There were no vitals filed for this visit.     Physical Exam: General: Well-appearing, no acute distress HENT: [place], AT Eyes: EOMI, no scleral icterus Respiratory: Diminished breath sounds to auscultation bilaterally.  No crackles, wheezing or rales Cardiovascular: RRR, -M/R/G, no JVD Extremities:-Edema,-tenderness Neuro: AAO x4, CNII-XII grossly intact Psych: Normal mood, normal affect  Data Reviewed:  Imaging: CXR 07/02/2014-normal chest x-ray.  No infiltrate effusion or edema CTA abdomen pelvis  10/08/2021-no active GI bleed.  Lower lobe lungs with no parenchymal abnormalities.  PFT: None on file  Labs: CBC    Component Value Date/Time   WBC 8.5 10/08/2021 1955   RBC 5.88 (H) 10/08/2021 1955   HGB 17.4 (H) 10/08/2021 1955   HCT 53.5 (H) 10/08/2021 1955   PLT 268 10/08/2021 1955   MCV 91.0 10/08/2021 1955   MCH 29.6 10/08/2021 1955   MCHC 32.5 10/08/2021 1955   RDW 13.4 10/08/2021 1955   LYMPHSABS 2.8 10/08/2021 1955   MONOABS 0.6 10/08/2021 1955   EOSABS 0.1 10/08/2021 1955   BASOSABS 0.1 10/08/2021 1955   Absolute eos 10/08/21 - 100  BMET    Component Value Date/Time   NA 140 10/08/2021 1955   K 4.1 10/08/2021 1955   CL 101 10/08/2021 1955   CO2 29 10/08/2021 1955   GLUCOSE 132 (H) 10/08/2021 1955   BUN 15 10/08/2021 1955   CREATININE 0.92 10/08/2021 1955   CALCIUM 9.1 10/08/2021 1955   GFRNONAA >60 10/08/2021 1955      Assessment & Plan:   Discussion: 56 year old male active smoker with IBS, constipation who presents for pre-op evaluation. Suspect he has COPD +/- COVID-19 long hauler. Will manage with ICS/LABA to optimize his respiratory symptoms and advised smoking cessation prior to procedure. He will also need Pulmonary function tests for further evaluation however he does not need to wait for these to be completed before his colonoscopy.  56 year old male active smoker with IBS, constipation who presents for follow-up.  He was initially seen for preop evaluation for suspected COPD+/- COVID-19 long-hauler who presents for follow-up.  He was previously started on ICS/LABA and advised on smoking cessation.  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 Patient does not need to wait to have this completed before his colonoscopy --START Advair ONE  puff TWICE a day. Provide Trelegy (ONCE a day) samples  --START Albuterol AS NEEDED for shortness of breath or wheezing  Excessive daytime sleepiness --ORDER home sleep study Health Maintenance  There is no immunization history on file for this patient. CT Lung Screen - Discuss at next visit  No orders of the defined types were placed in this encounter.  No orders of the defined types were placed in this encounter.   No follow-ups on file.   I have spent a total time of***-minutes on the day of the appointment reviewing prior documentation, coordinating care and discussing medical diagnosis and plan with the patient/family. Past medical history, allergies, medications were reviewed. Pertinent imaging, labs and tests included in this note have been reviewed and interpreted independently by me.  Keyonna Comunale Mechele Collin, MD [facility] 01/06/2022 8:03 AM  Office Number [phone]

## 2022-02-10 ENCOUNTER — Telehealth: Payer: Self-pay | Admitting: Pulmonary Disease

## 2022-02-11 ENCOUNTER — Telehealth: Payer: Self-pay | Admitting: Pulmonary Disease

## 2022-02-11 NOTE — Telephone Encounter (Signed)
Please contact patient regarding his insurance plan as noted by [facility]. We are unable to obtain home sleep study since we are out of network.  Recommend that he establish care with in-network primary care physician to continue work-up to rule out sleep apnea and for pulmonary rehab for his on-going respiratory symptoms if needed.

## 2022-02-11 NOTE — Telephone Encounter (Signed)
-----   Message from Donne Hazel sent at 02/10/2022  2:07 PM EDT ----- Regarding: No coverage for HST thru health plan Hello Dr Loanne Drilling Per this  pt's exchance plan thru [employer] pt and has no covered service for HST and also  We are out of network wt pt's plan   Please advise  Collette

## 2022-02-11 NOTE — Telephone Encounter (Signed)
Called [facility] about medications and spoke with Shaun who stated all they were needing was to have status changed to home delivery for pt's meds. This has been changed. Nothing further needed.

## 2022-02-12 NOTE — Telephone Encounter (Signed)
Called patient and informed him that he needs to call his insurance to determine who is in network for his insurance. I told him that per our PCCs we can not order the HST due to being out of network. Patient verbalized understanding. Nothing further needed

## 2023-06-30 IMAGING — CT CT CTA ABD/PEL W/CM AND/OR W/O CM
3 of 12 series · 12 of 46 positions shown, 17 images · IV contrast (agent unspecified)
Comparison: CT abdomen and pelvis 08/30/2014.

CLINICAL DATA: Rectal bleeding.  Upper abdominal pain.

EXAM:
CTA ABDOMEN AND PELVIS WITHOUT AND WITH CONTRAST
TECHNIQUE: Multidetector CT imaging of the abdomen and pelvis was performed
using the standard protocol during bolus administration of
intravenous contrast. Multiplanar reconstructed images and MIPs were
obtained and reviewed to evaluate the vascular anatomy.

[Series 6: axial arterial · axial · arterial · 0.90mm/px · z∈[-545,-135]mm · 7 of 283 slices shown]
[im 26/283  soft-tissue]
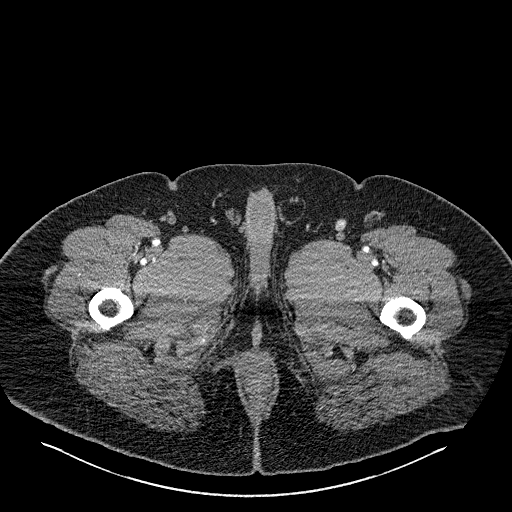
[im 52/283  soft-tissue]
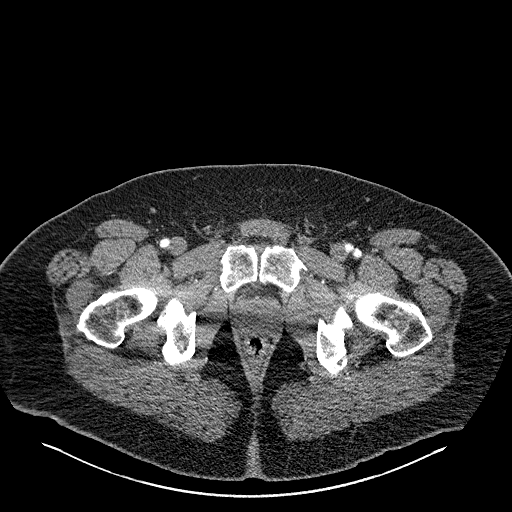
[im 103/283  soft-tissue]
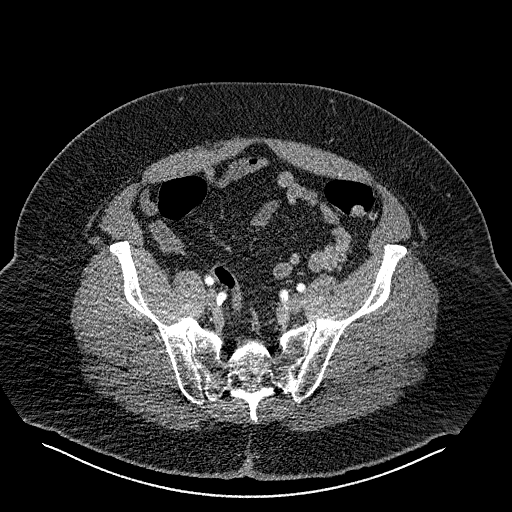
[im 129/283  soft-tissue]
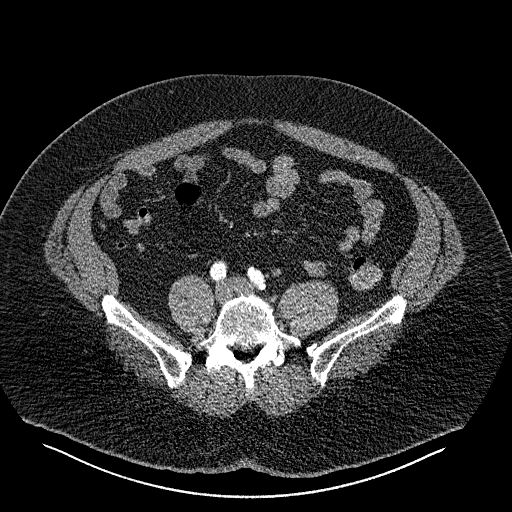
[im 154/283  soft-tissue]
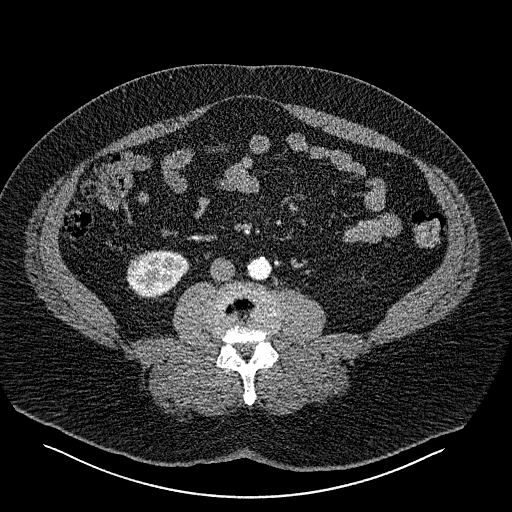
[im 180/283  soft-tissue]
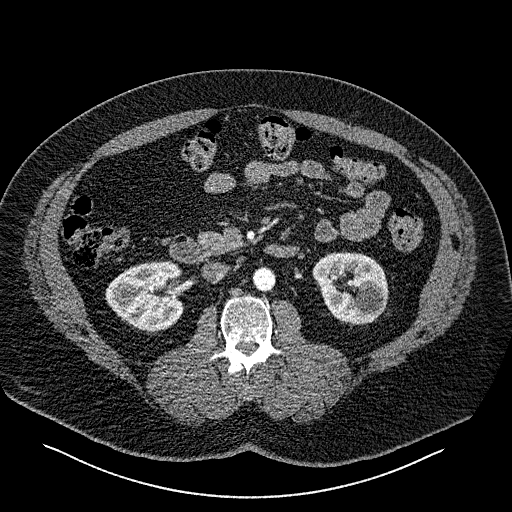
[im 231/283  soft-tissue]
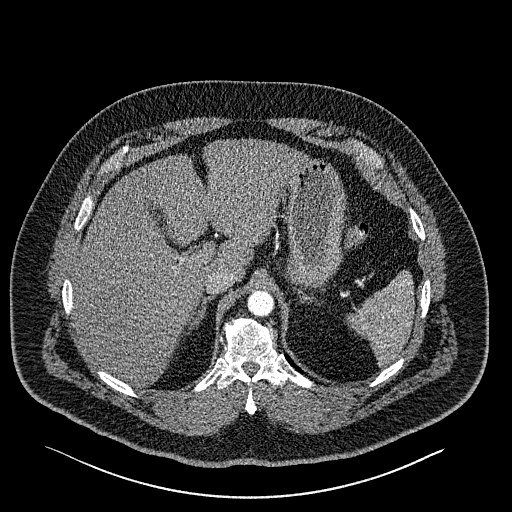

[Series 7: coronal arterial · coronal · arterial · 1.16mm/px · 2 of 179 slices shown, 3 images]
[im 60/179  soft-tissue]
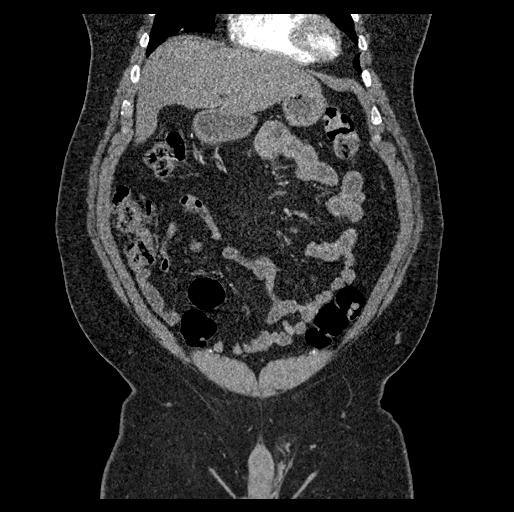
[im 60/179  bone]
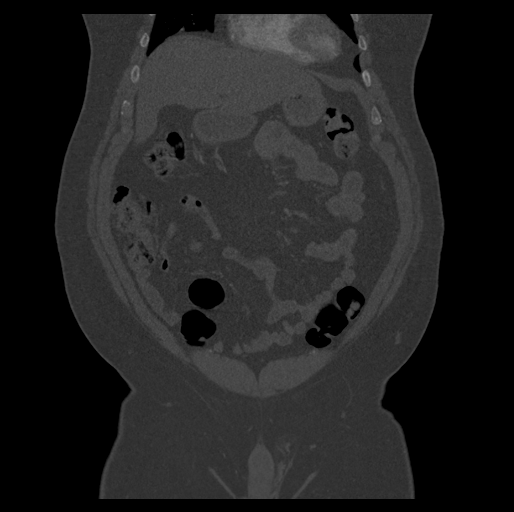
[im 119/179  soft-tissue]
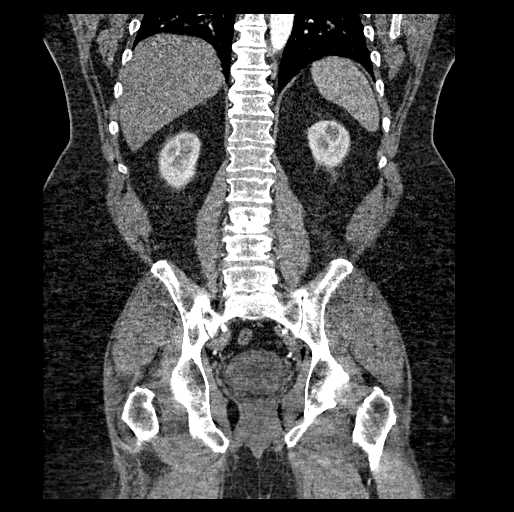

[Series 13: axial portal venous · axial · portal-venous · 0.89mm/px · z∈[-451,-171]mm · 3 of 113 slices shown, 7 images]
[im 29/113  soft-tissue]
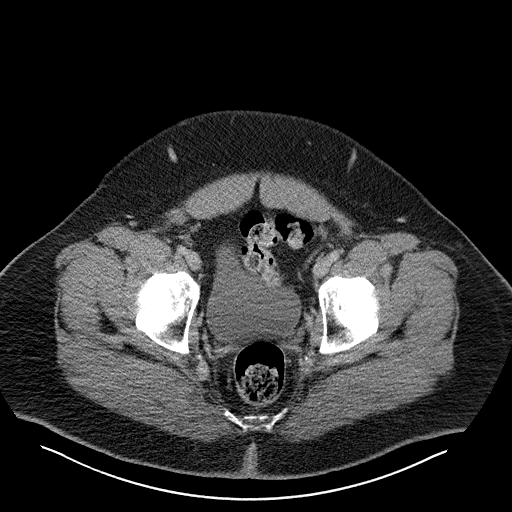
[im 29/113  lung]
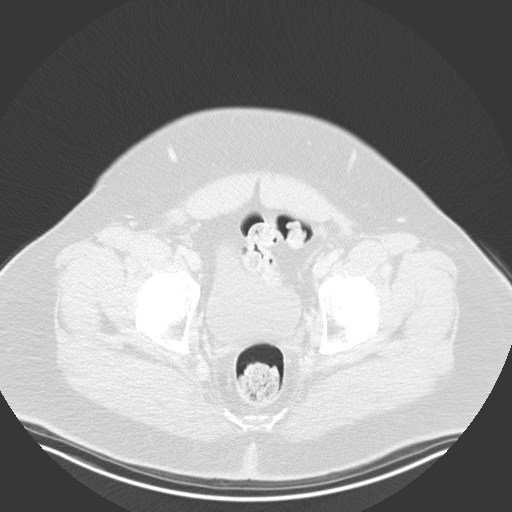
[im 29/113  bone]
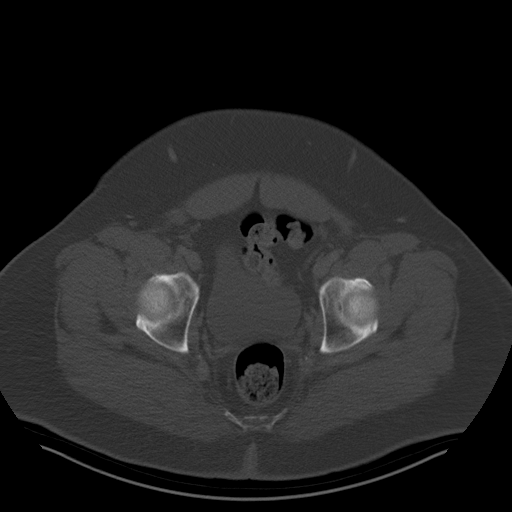
[im 57/113  soft-tissue]
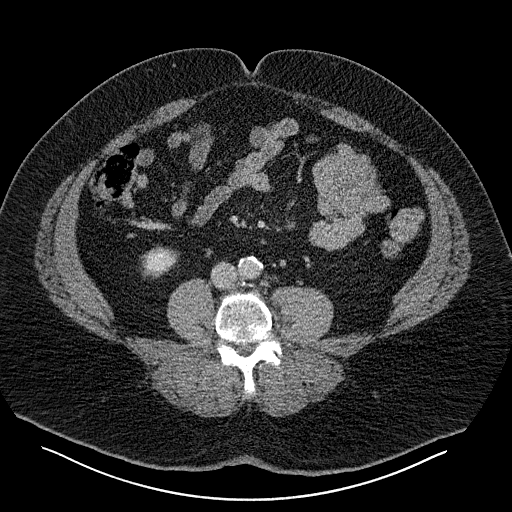
[im 57/113  lung]
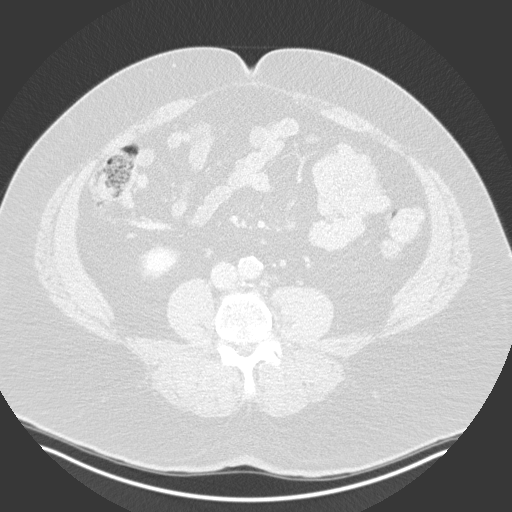
[im 85/113  soft-tissue]
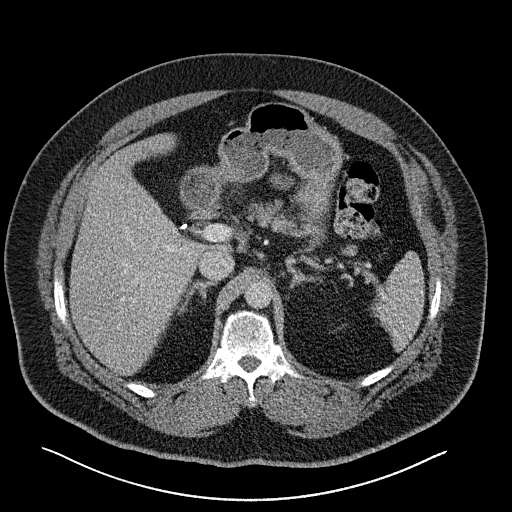
[im 85/113  lung]
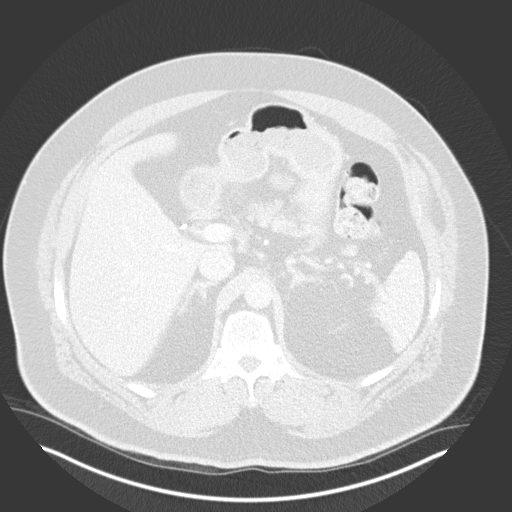

[12 of 46 positions shown; findings below may reference images not displayed]

RADIATION DOSE REDUCTION: This exam was performed according to the
departmental dose-optimization program which includes automated
exposure control, adjustment of the mA and/or kV according to
patient size and/or use of iterative reconstruction technique.

CONTRAST:  100mL OMNIPAQUE IOHEXOL 350 MG/ML SOLN
FINDINGS: VASCULAR

Aorta: Normal caliber aorta without aneurysm, dissection, vasculitis
or significant stenosis. There are atherosclerotic calcifications in
the aorta.

Celiac: Patent without evidence of aneurysm, dissection, vasculitis
or significant stenosis.

SMA: Patent without evidence of aneurysm, dissection, vasculitis or
significant stenosis.

Renals: Both renal arteries are patent without evidence of aneurysm,
dissection, vasculitis, fibromuscular dysplasia or significant
stenosis. There is an accessory left renal artery.

IMA: Patent without evidence of aneurysm, dissection, vasculitis or
significant stenosis.

Inflow: Patent without evidence of aneurysm, dissection, vasculitis
or significant stenosis. Atherosclerotic calcifications are present.

Proximal Outflow: Bilateral common femoral and visualized portions
of the superficial and profunda femoral arteries are patent without
evidence of aneurysm, dissection, vasculitis or significant
stenosis.

Veins: No obvious venous abnormality within the limitations of this
arterial phase study.

Review of the MIP images confirms the above findings.

NON-VASCULAR

Lower chest: No acute abnormality.

Hepatobiliary: No focal liver abnormality is seen. Status post
cholecystectomy. No biliary dilatation.

Pancreas: Unremarkable. No pancreatic ductal dilatation or
surrounding inflammatory changes.

Spleen: Normal in size without focal abnormality.

Adrenals/Urinary Tract: There is a 4.2 cm left renal cyst.
Otherwise, the adrenal glands, kidneys, and bladder are within
normal limits.

Stomach/Bowel: Stomach is within normal limits. Appendix appears
normal. No evidence of bowel wall thickening, distention, or
inflammatory changes. There is no evidence for active
gastrointestinal bleeding. There is colonic diverticulosis without
evidence for acute diverticulitis.

Lymphatic: No enlarged lymph nodes are identified.

Reproductive: Prostate is unremarkable.

Other: There are small fat containing inguinal hernias. There is no
ascites or free air.

Musculoskeletal: Degenerative changes affect the spine and hips.
IMPRESSION: VASCULAR

1. No active gastrointestinal bleeding identified. No acute vascular
pathology identified.
2.  Aortic Atherosclerosis ([id number]).

NON-VASCULAR

1. No acute process in the abdomen or pelvis.
2. Colonic diverticulosis.
3. 4.2 cm left renal cyst.
